# Patient Record
Sex: Male | Born: 1990 | Race: Black or African American | Hispanic: No | Marital: Single | Smoking: Current every day smoker
Health system: Southern US, Community
[De-identification: ages and names within clinical notes are randomized; demographics above are authoritative.]

## PROBLEM LIST (undated history)

## (undated) DIAGNOSIS — J45909 Unspecified asthma, uncomplicated: Secondary | ICD-10-CM

---

## 2012-03-17 ENCOUNTER — Other Ambulatory Visit (HOSPITAL_BASED_OUTPATIENT_CLINIC_OR_DEPARTMENT_OTHER): Payer: Self-pay | Admitting: Chiropractor

## 2012-03-17 DIAGNOSIS — S83209A Unspecified tear of unspecified meniscus, current injury, unspecified knee, initial encounter: Secondary | ICD-10-CM

## 2012-03-19 ENCOUNTER — Ambulatory Visit (HOSPITAL_BASED_OUTPATIENT_CLINIC_OR_DEPARTMENT_OTHER): Payer: Self-pay

## 2016-10-25 ENCOUNTER — Encounter (HOSPITAL_BASED_OUTPATIENT_CLINIC_OR_DEPARTMENT_OTHER): Payer: Self-pay | Admitting: Emergency Medicine

## 2016-10-25 ENCOUNTER — Emergency Department (HOSPITAL_BASED_OUTPATIENT_CLINIC_OR_DEPARTMENT_OTHER)
Admission: EM | Admit: 2016-10-25 | Discharge: 2016-10-26 | Disposition: A | Discharge status: Home / Self Care | Payer: Self-pay | Attending: Emergency Medicine | Admitting: Emergency Medicine

## 2016-10-25 DIAGNOSIS — R8299 Other abnormal findings in urine: Secondary | ICD-10-CM | POA: Insufficient documentation

## 2016-10-25 DIAGNOSIS — A5900 Urogenital trichomoniasis, unspecified: Secondary | ICD-10-CM | POA: Insufficient documentation

## 2016-10-25 DIAGNOSIS — M533 Sacrococcygeal disorders, not elsewhere classified: Secondary | ICD-10-CM

## 2016-10-25 DIAGNOSIS — J45909 Unspecified asthma, uncomplicated: Secondary | ICD-10-CM | POA: Insufficient documentation

## 2016-10-25 HISTORY — DX: Unspecified asthma, uncomplicated: J45.909

## 2016-10-25 LAB — URINALYSIS, MICROSCOPIC (REFLEX): Bacteria, UA: NONE SEEN

## 2016-10-25 LAB — URINALYSIS, ROUTINE W REFLEX MICROSCOPIC
Bilirubin Urine: NEGATIVE
Glucose, UA: NEGATIVE mg/dL
HGB URINE DIPSTICK: NEGATIVE
Ketones, ur: NEGATIVE mg/dL
Nitrite: NEGATIVE
PROTEIN: NEGATIVE mg/dL
Specific Gravity, Urine: 1.023 (ref 1.005–1.030)
pH: 6 (ref 5.0–8.0)

## 2016-10-25 NOTE — ED Triage Notes (Signed)
PT presents to ED with c/o lower back pain

## 2016-10-26 MED ORDER — NAPROXEN 500 MG PO TABS: 500.0000 mg | ORAL_TABLET | Freq: Two times a day (BID) | ORAL | 0 refills | Status: DC | PRN

## 2016-10-26 MED ORDER — NAPROXEN 250 MG PO TABS
500.0000 mg | ORAL_TABLET | Freq: Once | ORAL | Status: AC
  Administered 2016-10-26: 500 mg via ORAL
  Filled 2016-10-26: qty 2

## 2016-10-26 MED ORDER — METRONIDAZOLE 500 MG PO TABS
2000.0000 mg | ORAL_TABLET | Freq: Once | ORAL | Status: AC
  Administered 2016-10-26: 2000 mg via ORAL
  Filled 2016-10-26: qty 4

## 2016-10-26 NOTE — ED Provider Notes (Signed)
[facility] Provider Note: Lowella Dell, MD, FACEP  CSN: [id number] MRN: [id number] ARRIVAL: 10/25/16 at 2303 ROOM: MH02/MH02  By signing my name below, I, Soijett Blue, attest that this documentation has been prepared under the direction and in the presence of Paula Libra, MD. Electronically Signed: Soijett Blue, ED Scribe. 10/26/16. 12:22 AM.  CHIEF COMPLAINT  Back Pain   HISTORY OF PRESENT ILLNESS  Tom Liu is a 26 y.o. male who presents to the Emergency Department complaining of right lower back pain onset 1 week ago. The pain originates in the right SI joint. It radiates to his groin and he describes it as burning sensation. He notes that his right lower back pain is worsened with prolonged standing. Pt states that his lower back pain began following heavy lifting several months ago. Pt has not tried any medications for the relief of his symptoms. Pt denies dysuria, penile discharge or abdominal discomfort.    Past Medical History:  Diagnosis Date  . Asthma     History reviewed. No pertinent surgical history.  No family history on file.  Social History  Substance Use Topics  . Smoking status: Not on file  . Smokeless tobacco: Not on file  . Alcohol use Not on file    Prior to Admission medications   Medication Sig Start Date End Date Taking? Authorizing Provider  naproxen (NAPROSYN) 500 MG tablet Take 1 tablet (500 mg total) by mouth 2 (two) times daily as needed (for back pain). 10/26/16   Paula Libra, MD    Allergies Patient has no known allergies.   REVIEW OF SYSTEMS  Negative except as noted here or in the History of Present Illness.   PHYSICAL EXAMINATION  Initial Vital Signs Blood pressure (!) 136/93, pulse 84, temperature 97.9 F (36.6 C), temperature source Oral, resp. rate 16, height 6' 1.75" (1.873 m), weight 223 lb (101.2 kg), SpO2 99 %.  Examination General: Well-developed, well-nourished male in no acute distress; appearance  consistent with age of record HENT: normocephalic; atraumatic Eyes: pupils equal, round and reactive to light; extraocular muscles intact Neck: supple Heart: regular rate and rhythm Lungs: clear to auscultation bilaterally Abdomen: soft; nondistended; nontender; no masses or hepatosplenomegaly; bowel sounds present GU: No CVA tenderness Back: No lumbar or SI tenderness; negative SLR bilaterally. Extremities: No deformity; full range of motion; pulses normal Neurologic: Awake, alert and oriented; motor function intact in all extremities and symmetric; no facial droop Skin: Warm and dry Psychiatric: Normal mood and affect   RESULTS  Summary of this visit's results, reviewed by myself:   EKG Interpretation  Date/Time:    Ventricular Rate:    PR Interval:    QRS Duration:   QT Interval:    QTC Calculation:   R Axis:     Text Interpretation:        Laboratory Studies: Results for orders placed or performed during the hospital encounter of 10/25/16 (from the past 24 hour(s))  Urinalysis, Routine w reflex microscopic- may I&O cath if menses     Status: Abnormal   Collection Time: 10/25/16 11:25 PM  Result Value Ref Range   Color, Urine YELLOW YELLOW   APPearance CLEAR CLEAR   Specific Gravity, Urine 1.023 1.005 - 1.030   pH 6.0 5.0 - 8.0   Glucose, UA NEGATIVE NEGATIVE mg/dL   Hgb urine dipstick NEGATIVE NEGATIVE   Bilirubin Urine NEGATIVE NEGATIVE   Ketones, ur NEGATIVE NEGATIVE mg/dL   Protein, ur NEGATIVE NEGATIVE mg/dL  Nitrite NEGATIVE NEGATIVE   Leukocytes, UA LARGE (A) NEGATIVE  Urinalysis, Microscopic (reflex)     Status: Abnormal   Collection Time: 10/25/16 11:25 PM  Result Value Ref Range   RBC / HPF 0-5 0 - 5 RBC/hpf   WBC, UA 6-30 0 - 5 WBC/hpf   Bacteria, UA NONE SEEN NONE SEEN   Squamous Epithelial / LPF 0-5 (A) NONE SEEN   Trichomonas, UA PRESENT    Imaging Studies: No results found.  ED COURSE  Nursing notes and initial vitals signs, including  pulse oximetry, reviewed.  Vitals:   10/25/16 2309  BP: (!) 136/93  Pulse: 84  Resp: 16  Temp: 97.9 F (36.6 C)  TempSrc: Oral  SpO2: 99%  Weight: 223 lb (101.2 kg)  Height: 6' 1.75" (1.873 m)   12:27 AM We'll treat for trichomoniasis. Urine sent for culture, gonorrhea and chlamydia testing. We'll also test for HIV and syphilis.  PROCEDURES    ED DIAGNOSES     ICD-9-CM ICD-10-CM   1. Sacroiliac pain 724.6 M53.3   2. Urogenital trichomoniasis 131.00 A59.00    I personally performed the services described in this documentation, which was scribed in my presence. The recorded information has been reviewed and is accurate.     Paula Libra, MD 10/26/16 Jacinta Shoe

## 2016-10-27 LAB — RPR: RPR: NONREACTIVE

## 2016-10-27 LAB — HIV ANTIBODY (ROUTINE TESTING W REFLEX): HIV SCREEN 4TH GENERATION: NONREACTIVE

## 2016-10-27 LAB — URINE CULTURE: Culture: NO GROWTH

## 2016-10-27 LAB — GC/CHLAMYDIA PROBE AMP (~~LOC~~) NOT AT ARMC
Chlamydia: POSITIVE — AB
Neisseria Gonorrhea: NEGATIVE

## 2016-12-14 ENCOUNTER — Emergency Department (HOSPITAL_BASED_OUTPATIENT_CLINIC_OR_DEPARTMENT_OTHER): Payer: Self-pay

## 2016-12-14 ENCOUNTER — Encounter (HOSPITAL_BASED_OUTPATIENT_CLINIC_OR_DEPARTMENT_OTHER): Payer: Self-pay

## 2016-12-14 ENCOUNTER — Emergency Department (HOSPITAL_BASED_OUTPATIENT_CLINIC_OR_DEPARTMENT_OTHER)
Admission: EM | Admit: 2016-12-14 | Discharge: 2016-12-14 | Disposition: A | Discharge status: Home / Self Care | Payer: Self-pay | Attending: Emergency Medicine | Admitting: Emergency Medicine

## 2016-12-14 DIAGNOSIS — J9801 Acute bronchospasm: Secondary | ICD-10-CM | POA: Insufficient documentation

## 2016-12-14 MED ORDER — DEXAMETHASONE SODIUM PHOSPHATE 10 MG/ML IJ SOLN
10.0000 mg | Freq: Once | INTRAMUSCULAR | Status: AC
  Administered 2016-12-14: 10 mg via INTRAMUSCULAR
  Filled 2016-12-14: qty 1

## 2016-12-14 MED ORDER — ALBUTEROL SULFATE (2.5 MG/3ML) 0.083% IN NEBU
5.0000 mg | INHALATION_SOLUTION | RESPIRATORY_TRACT | Status: AC
  Administered 2016-12-14: 5 mg via RESPIRATORY_TRACT
  Filled 2016-12-14: qty 6

## 2016-12-14 MED ORDER — IPRATROPIUM BROMIDE 0.02 % IN SOLN
0.5000 mg | RESPIRATORY_TRACT | Status: AC
  Administered 2016-12-14: 0.5 mg via RESPIRATORY_TRACT
  Filled 2016-12-14: qty 2.5

## 2016-12-14 MED ORDER — ALBUTEROL SULFATE HFA 108 (90 BASE) MCG/ACT IN AERS
2.0000 | INHALATION_SPRAY | RESPIRATORY_TRACT | Status: DC | PRN
  Administered 2016-12-14: 2 via RESPIRATORY_TRACT
  Filled 2016-12-14: qty 6.7

## 2016-12-14 NOTE — ED Provider Notes (Signed)
[facility] Provider Note: Lowella Dell, MD, FACEP  CSN: [id number] MRN: [id number] ARRIVAL: 12/14/16 at 2150 ROOM: MH11/MH11   CHIEF COMPLAINT  Wheezing   HISTORY OF PRESENT ILLNESS  Tom Liu is a 26 y.o. male with a history of asthma. He has had a one-week history of shortness of breath with wheezing and chest discomfort. He had been using his home inhaler but without adequate relief. His symptoms worsened yesterday evening and he came to the ED this evening for evaluation. He rates his chest discomfort as a tightness which he rates as an 8 out of 10. He was evaluated by respiratory therapy who found him to be wheezing and administered an albuterol and Atrovent neb treatment with significant improvement in his wheezing and air movement. He has had a cough occasionally productive of yellow sputum. He denies fever or chills. He denies nausea, vomiting or diarrhea.   Past Medical History:  Diagnosis Date  . Asthma     History reviewed. No pertinent surgical history.  No family history on file.  Social History  Substance Use Topics  . Smoking status: Not on file  . Smokeless tobacco: Not on file  . Alcohol use Not on file    Prior to Admission medications   Not on File    Allergies Patient has no known allergies.   REVIEW OF SYSTEMS  Negative except as noted here or in the History of Present Illness.   PHYSICAL EXAMINATION  Initial Vital Signs Blood pressure 109/72, pulse (!) 115, temperature 98.6 F (37 C), temperature source Oral, resp. rate 20, height 6\' 1"  (1.854 m), weight 102.1 kg (225 lb), SpO2 99 %.  Examination General: Well-developed, well-nourished male in no acute distress; appearance consistent with age of record HENT: normocephalic; atraumatic Eyes: pupils equal, round and reactive to light; extraocular muscles intact Neck: supple Heart: regular rate and rhythm Lungs: clear to auscultation bilaterally; occasional rattly  cough Abdomen: soft; nondistended; nontender; no masses or hepatosplenomegaly; bowel sounds present Extremities: No deformity; full range of motion; pulses normal Neurologic: Awake, alert and oriented; motor function intact in all extremities and symmetric; no facial droop Skin: Warm and dry Psychiatric: Normal mood and affect   RESULTS  Summary of this visit's results, reviewed by myself:   EKG Interpretation  Date/Time:  Monday Dec 14 2016 21:58:19 EDT Ventricular Rate:  117 PR Interval:  164 QRS Duration: 96 QT Interval:  324 QTC Calculation: 451 R Axis:   112 Text Interpretation:  Sinus tachycardia Possible Right ventricular hypertrophy Cannot rule out Inferior infarct , age undetermined Abnormal ECG No previous ECGs available Confirmed by Paula Libra ([id number]) on 12/14/2016 11:18:19 PM      Laboratory Studies: No results found for this or any previous visit (from the past 24 hour(s)). Imaging Studies: Dg Chest 2 View  Result Date: 12/14/2016 CLINICAL DATA:  Initial evaluation for acute wheezing. EXAM: CHEST  2 VIEW COMPARISON:  Prior radiograph 08/24/2014. FINDINGS: The cardiac and mediastinal silhouettes are stable in size and contour, and remain within normal limits. The lungs are normally inflated. No airspace consolidation, pleural effusion, or pulmonary edema is identified. There is no pneumothorax. No acute osseous abnormality identified. IMPRESSION: No active cardiopulmonary disease. Electronically Signed   By: Rise Mu M.D.   On: 12/14/2016 22:31    ED COURSE  Nursing notes and initial vitals signs, including pulse oximetry, reviewed.  Vitals:   12/14/16 2159 12/14/16 2202 12/14/16 2206  BP: 109/72  Pulse: (!) 115    Resp: 20    Temp: 98.6 F (37 C)    TempSrc: Oral    SpO2: 99%  99%  Weight:  102.1 kg (225 lb)   Height:  6\' 1"  (1.854 m)     PROCEDURES    ED DIAGNOSES     ICD-9-CM ICD-10-CM   1. Bronchospasm 519.11 J98.01         Hibba Schram, Jonny RuizJohn, MD 12/14/16 2329

## 2016-12-14 NOTE — ED Triage Notes (Signed)
Pt c/o chest tightness and wheezing for a week, has expired inhaler at home that he's been using without relief

## 2016-12-14 NOTE — ED Notes (Signed)
Pt verbalizes understanding of d/c instructions and denies any further needs at this time. 

## 2017-03-01 ENCOUNTER — Encounter (HOSPITAL_BASED_OUTPATIENT_CLINIC_OR_DEPARTMENT_OTHER): Payer: Self-pay

## 2017-03-01 ENCOUNTER — Emergency Department (HOSPITAL_BASED_OUTPATIENT_CLINIC_OR_DEPARTMENT_OTHER): Payer: Self-pay

## 2017-03-01 ENCOUNTER — Emergency Department (HOSPITAL_BASED_OUTPATIENT_CLINIC_OR_DEPARTMENT_OTHER)
Admission: EM | Admit: 2017-03-01 | Discharge: 2017-03-01 | Disposition: A | Discharge status: Home / Self Care | Payer: Self-pay | Attending: Emergency Medicine | Admitting: Emergency Medicine

## 2017-03-01 DIAGNOSIS — M25461 Effusion, right knee: Secondary | ICD-10-CM | POA: Insufficient documentation

## 2017-03-01 DIAGNOSIS — M25561 Pain in right knee: Secondary | ICD-10-CM

## 2017-03-01 DIAGNOSIS — F17210 Nicotine dependence, cigarettes, uncomplicated: Secondary | ICD-10-CM | POA: Insufficient documentation

## 2017-03-01 DIAGNOSIS — J45909 Unspecified asthma, uncomplicated: Secondary | ICD-10-CM | POA: Insufficient documentation

## 2017-03-01 NOTE — ED Notes (Signed)
MD with pt  

## 2017-03-01 NOTE — ED Notes (Signed)
Work note given that pt an return on 8-7 with no restrictions.

## 2017-03-01 NOTE — ED Notes (Signed)
Ice pack given and knee sleeve applied.

## 2017-03-01 NOTE — ED Notes (Signed)
Report received 

## 2017-03-01 NOTE — Discharge Instructions (Signed)
I suspect you have a partial tear in your knee cartilage. That usually heals by itself, but can take up to six months. You will need to see an orthopedic doctor, and probably get an MRI scan to be sure.  Ice your knee before going to work, during any break at work, and when you get home. Wear an elastic knee brace as needed. Take either ibuprofen (3 tablets at a time, four times a day) or naproxen (two tablets twice a day.

## 2017-03-01 NOTE — ED Triage Notes (Signed)
Reports dislocated right knee in July and still having pain with standing in his quad region.  Reports work sent him to be evaluated due to risk of injury

## 2017-03-01 NOTE — ED Provider Notes (Signed)
[facility]   CSN: [id number] Arrival date & time: 03/01/17  0011     History   Chief Complaint Chief Complaint  Patient presents with  . Leg Pain    HPI Tom Liu is a 26 y.o. male.  The history is provided by the patient.  Leg Pain    He complains of ongoing pain in his right thigh since dislocating his kneecap on July 4. He states that he had also dislocated his kneecap when he was 26 years old. Kneecap was dislocated medially, and he reduced it himself. He went to [facility], and was referred to an orthopedic physician, but did not follow-up because of lack of funds. Since then, he continues to have pain in his right thigh anteriorly. Pain is moderate and he rates it at 5/10. He has also noticed some swelling in his right knee. He has also noted his knee giving way at times, and also locking occasionally.  Past Medical History:  Diagnosis Date  . Asthma     There are no active problems to display for this patient.   History reviewed. No pertinent surgical history.     Home Medications    Prior to Admission medications   Not on File    Family History No family history on file.  Social History Social History  Substance Use Topics  . Smoking status: Current Every Day Smoker    Types: Cigarettes  . Smokeless tobacco: Never Used  . Alcohol use No     Allergies   Patient has no known allergies.   Review of Systems Review of Systems  All other systems reviewed and are negative.    Physical Exam Updated Vital Signs BP (!) 107/91 (BP Location: Left Arm)   Pulse 95   Temp 99.3 F (37.4 C) (Oral)   Resp 18   Ht 6\' 2"  (1.88 m)   Wt 97.5 kg (215 lb)   SpO2 98%   BMI 27.60 kg/m   Physical Exam  Nursing note and vitals reviewed.  26 year old male, resting comfortably and in no acute distress. Vital signs are normal. Oxygen saturation is 98%, which is normal. Head is normocephalic and  atraumatic. PERRLA, EOMI. Oropharynx is clear. Neck is nontender and supple without adenopathy or JVD. Back is nontender and there is no CVA tenderness. Lungs are clear without rales, wheezes, or rhonchi. Chest is nontender. Heart has regular rate and rhythm without murmur. Abdomen is soft, flat, nontender without masses or hepatosplenomegaly and peristalsis is normoactive. Extremities: Small to moderate effusion present in the right knee. No tenderness to palpation. Pain elicited with passive flexion past 90 with positive McMurray's test on the left. Lachman test is negative. Skin is warm and dry without rash. Neurologic: Mental status is normal, cranial nerves are intact, there are no motor or sensory deficits.  ED Treatments / Results   Radiology Dg Knee Complete 4 Views Right  Result Date: 03/01/2017 CLINICAL DATA:  Right knee injury playing basketball on 01/27/2017 with pain and swelling since. History of knee dislocations several times. EXAM: RIGHT KNEE - COMPLETE 4+ VIEW COMPARISON:  Exams dating back through 02/27/2012 FINDINGS: No acute fracture or dislocation. No significant joint effusion. Well corticated ossifications along the medial aspect of the knee along the expected location of the MCL may reflect old remote trauma but is chronic in appearance. IMPRESSION: 1. No acute osseous abnormality, dislocation or joint effusion. 2. Three well corticated soft tissue  ossifications along the medial aspect of the knee are chronic and may reflect old remote trauma possibly along the MCL. 3. Cartilaginous, ligamentous and meniscal injuries would be better assessed with MRI if clinically suspected on a nonemergent basis. Electronically Signed   By: Tollie Eth M.D.   On: 03/01/2017 01:18    Procedures Procedures (including critical care time)  Medications Ordered in ED Medications - No data to display   Initial Impression / Assessment and Plan / ED Course  I have reviewed the triage vital  signs and the nursing notes.  Pertinent  imaging results that were available during my care of the patient were reviewed by me and considered in my medical decision making (see chart for details).  Right distal thigh pain and right knee effusion following dislocation of the right patella. Physical exam is suggestive of partial tear of meniscus. He is advised to use a neoprene sleeve for support, and is referred to orthopedics for follow-up. MRI will been needed to definitely diagnose a meniscus tear, but this should be treated conservatively unless it fails to heal. Old records are reviewed confirming ED visit in [facility] one month ago with negative x-rays and orthopedic referral.  Final Clinical Impressions(s) / ED Diagnoses   Final diagnoses:  Right knee pain, unspecified chronicity  Effusion of right knee joint    New Prescriptions New Prescriptions   No medications on file     Dione Booze, MD 03/01/17 [phone]

## 2017-06-04 IMAGING — CR DG CHEST 2V
2 series · 2 of 2 positions shown · non-contrast
Comparison: Prior radiograph 08/24/2014.

CLINICAL DATA: Initial evaluation for acute wheezing.

EXAM:
CHEST  2 VIEW

[w chest pa]
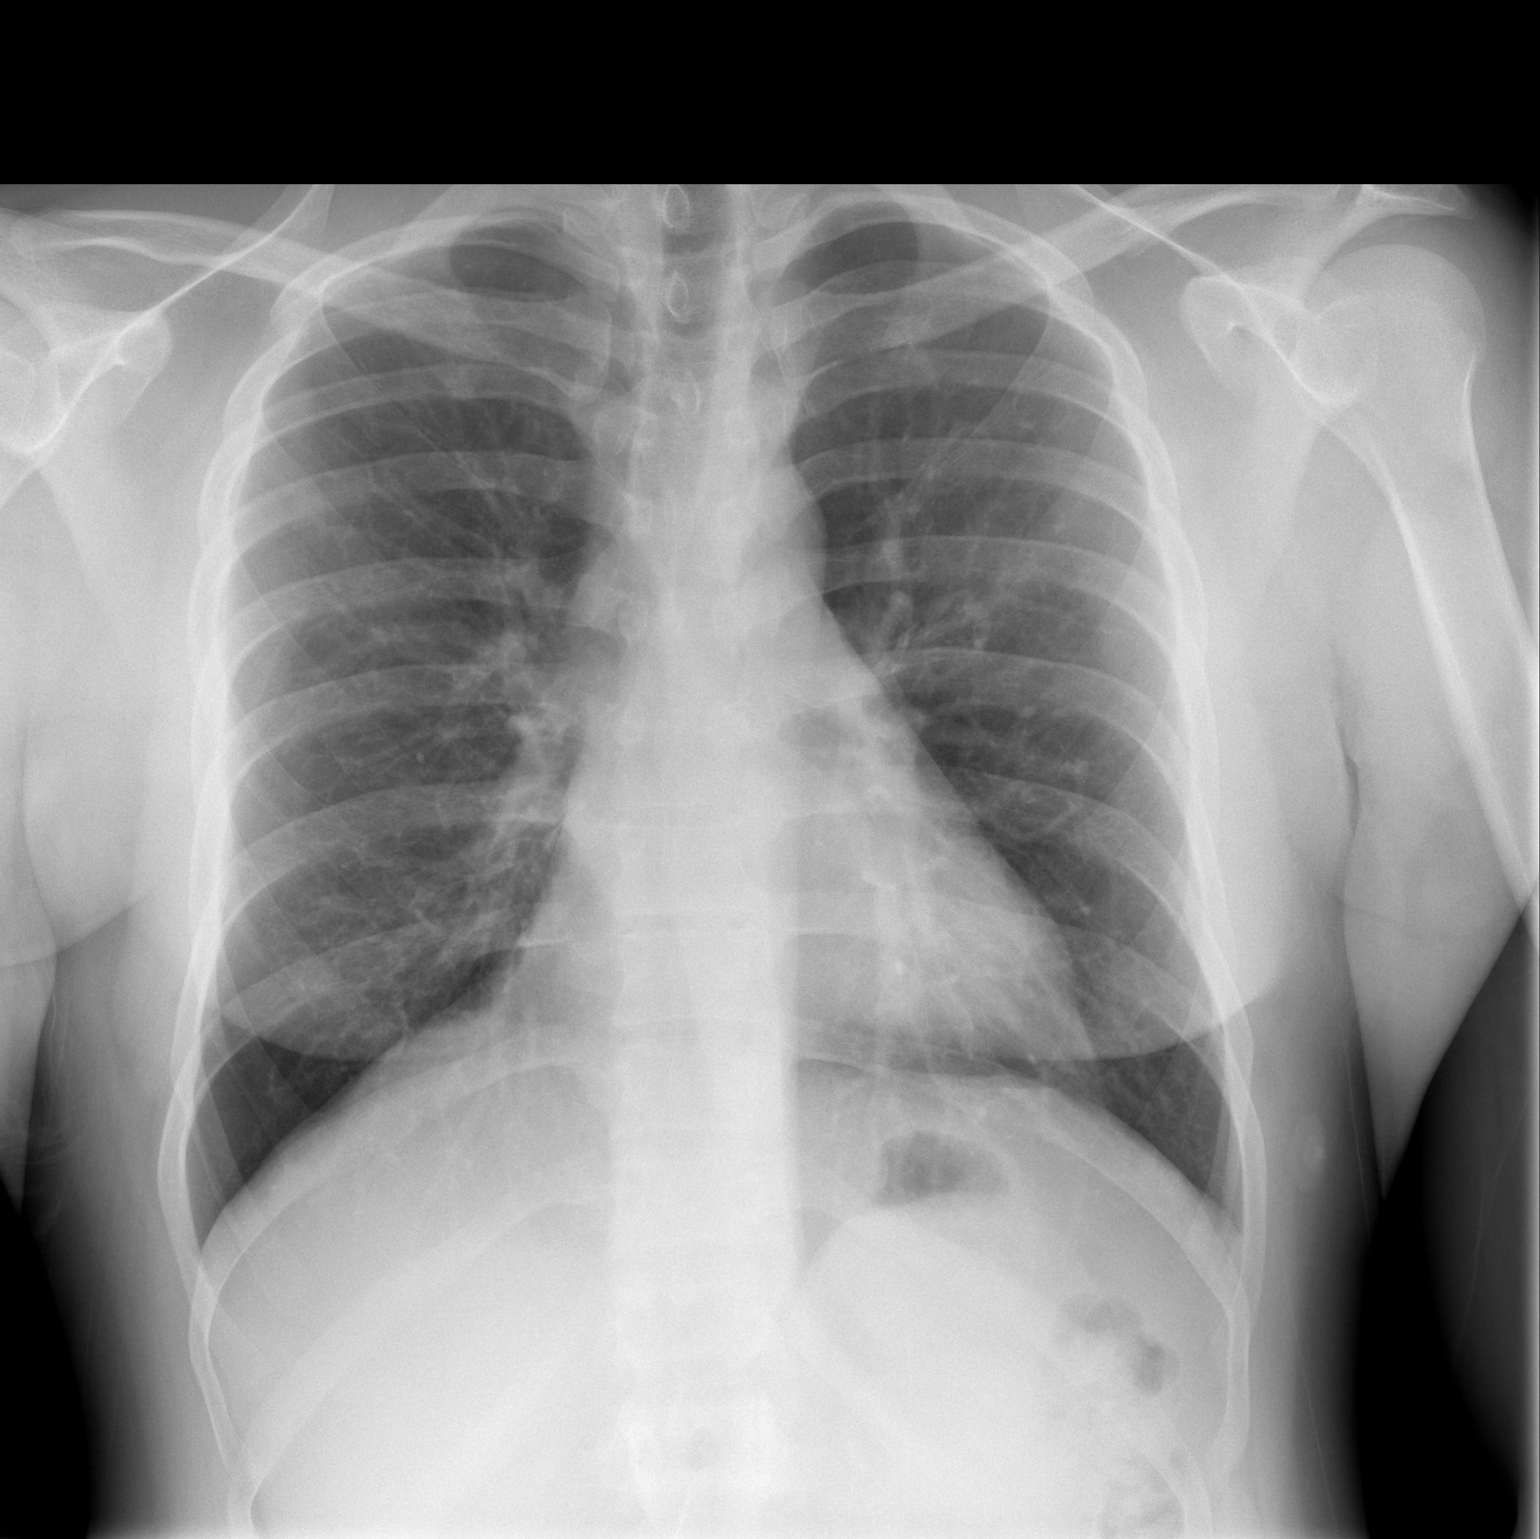

[w chest lat]
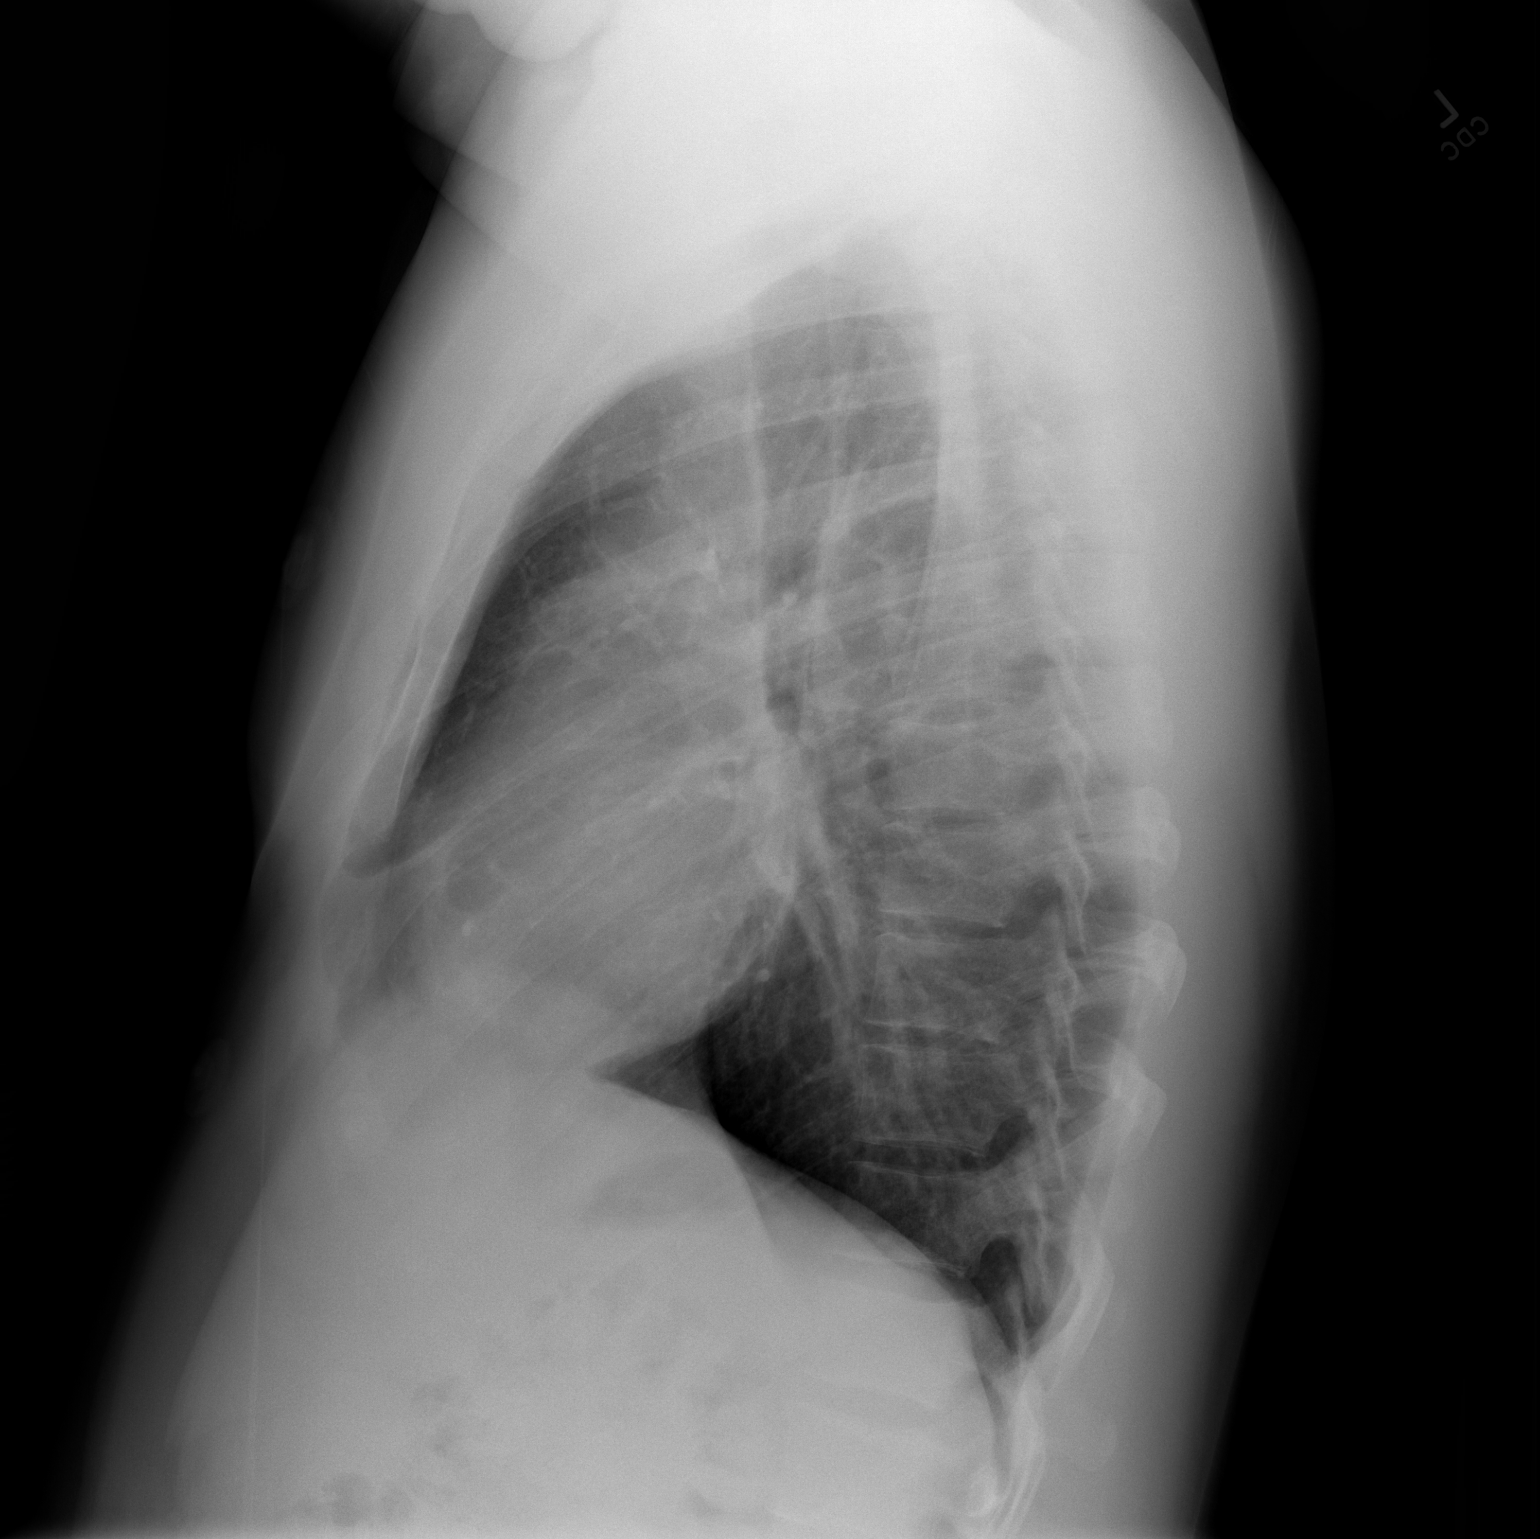

[2 of 2 positions shown; findings below may reference images not displayed]

FINDINGS: The cardiac and mediastinal silhouettes are stable in size and
contour, and remain within normal limits.

The lungs are normally inflated. No airspace consolidation, pleural
effusion, or pulmonary edema is identified. There is no
pneumothorax.

No acute osseous abnormality identified.
IMPRESSION: No active cardiopulmonary disease.

## 2018-05-01 ENCOUNTER — Other Ambulatory Visit: Payer: Self-pay

## 2018-05-01 ENCOUNTER — Emergency Department (HOSPITAL_BASED_OUTPATIENT_CLINIC_OR_DEPARTMENT_OTHER)
Admission: EM | Admit: 2018-05-01 | Discharge: 2018-05-01 | Disposition: A | Discharge status: Home / Self Care | Payer: Self-pay | Attending: Emergency Medicine | Admitting: Emergency Medicine

## 2018-05-01 ENCOUNTER — Encounter (HOSPITAL_BASED_OUTPATIENT_CLINIC_OR_DEPARTMENT_OTHER): Payer: Self-pay | Admitting: Emergency Medicine

## 2018-05-01 DIAGNOSIS — R8281 Pyuria: Secondary | ICD-10-CM

## 2018-05-01 DIAGNOSIS — F17210 Nicotine dependence, cigarettes, uncomplicated: Secondary | ICD-10-CM | POA: Insufficient documentation

## 2018-05-01 DIAGNOSIS — N50819 Testicular pain, unspecified: Secondary | ICD-10-CM

## 2018-05-01 DIAGNOSIS — M545 Low back pain, unspecified: Secondary | ICD-10-CM

## 2018-05-01 DIAGNOSIS — J45909 Unspecified asthma, uncomplicated: Secondary | ICD-10-CM | POA: Insufficient documentation

## 2018-05-01 LAB — URINALYSIS, ROUTINE W REFLEX MICROSCOPIC
BILIRUBIN URINE: NEGATIVE
GLUCOSE, UA: NEGATIVE mg/dL
HGB URINE DIPSTICK: NEGATIVE
Ketones, ur: NEGATIVE mg/dL
Nitrite: NEGATIVE
PROTEIN: NEGATIVE mg/dL
Specific Gravity, Urine: 1.02 (ref 1.005–1.030)
pH: 6 (ref 5.0–8.0)

## 2018-05-01 LAB — URINALYSIS, MICROSCOPIC (REFLEX)

## 2018-05-01 MED ORDER — CEFTRIAXONE SODIUM 250 MG IJ SOLR
250.0000 mg | Freq: Once | INTRAMUSCULAR | Status: AC
  Administered 2018-05-01: 250 mg via INTRAMUSCULAR
  Filled 2018-05-01: qty 250

## 2018-05-01 MED ORDER — AZITHROMYCIN 250 MG PO TABS
1000.0000 mg | ORAL_TABLET | Freq: Once | ORAL | Status: AC
  Administered 2018-05-01: 1000 mg via ORAL
  Filled 2018-05-01: qty 4

## 2018-05-01 MED ORDER — IBUPROFEN 600 MG PO TABS: 600.0000 mg | ORAL_TABLET | Freq: Four times a day (QID) | ORAL | 0 refills | Status: DC | PRN

## 2018-05-01 MED ORDER — IBUPROFEN 600 MG PO TABS
600.0000 mg | ORAL_TABLET | Freq: Four times a day (QID) | ORAL | 0 refills | Status: AC | PRN
Start: 2018-05-01 — End: ?

## 2018-05-01 NOTE — ED Notes (Signed)
ED Provider at bedside. 

## 2018-05-01 NOTE — ED Provider Notes (Addendum)
[facility] EMERGENCY DEPARTMENT Provider Note   CSN: [id number] Arrival date & time: 05/01/18  0900     History   Chief Complaint Chief Complaint  Patient presents with  . Back Pain    HPI Tom Liu is a 27 y.o. male.  HPI  27 year old male comes in with chief complaint of back pain and testicle pain. 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  On Friday after he returned from work he started noticing pain in his back.  As the day progressed he appreciated constant back pain with intermittent episodes of throbbing type scrotal pain.  The scrotal pain lasts only for a few seconds and is unprovoked.  Patient denies any history of similar pain in the past.  Patient denies any bulging that he has appreciated and denies any dysuria, n/v/f/c.  Past Medical History:  Diagnosis Date  . Asthma     There are no active problems to display for this patient.   History reviewed. No pertinent surgical history.      Home Medications    Prior to Admission medications   Medication Sig Start Date End Date Taking? Authorizing Provider  ibuprofen (ADVIL,MOTRIN) 600 MG tablet Take 1 tablet (600 mg total) by mouth every 6 (six) hours as needed. 05/01/18   Derwood Kaplan, MD    Family History No family history on file.  Social History Social History   Tobacco Use  . Smoking status: Current Every Day Smoker    Types: Cigarettes  . Smokeless tobacco: Never Used  Substance Use Topics  . Alcohol use: Yes  . Drug use: No     Allergies   Patient has no known allergies.   Review of Systems Review of Systems  Constitutional: Positive for activity change.  Gastrointestinal: Negative for abdominal distention.  Genitourinary: Positive for testicular pain. Negative for dysuria and scrotal swelling.  Musculoskeletal: Positive for back pain.     Physical Exam Updated Vital Signs BP 137/80 (BP Location: Right Arm)   Pulse 63   Temp 98 F  (36.7 C) (Oral)   Resp 14   Ht 6' 1.75" (1.873 m)   Wt 102.1 kg   SpO2 100%   BMI 29.08 kg/m   Physical Exam  Constitutional: He is oriented to person, place, and time. He appears well-developed.  HENT:  Head: Atraumatic.  Neck: Neck supple.  Cardiovascular: Normal rate.  Pulmonary/Chest: Effort normal.  Abdominal: Soft.  Genitourinary:  Genitourinary Comments: No inguinal hernia, scrotal hernia appreciated.  Testicles are freely moving.  Neurological: He is alert and oriented to person, place, and time.  Skin: Skin is warm.  Nursing note and vitals reviewed.    ED Treatments / Results  Labs (all labs ordered are listed, but only abnormal results are displayed) Labs Reviewed  URINALYSIS, ROUTINE W REFLEX MICROSCOPIC - Abnormal; Notable for the following components:      Result Value   Leukocytes, UA MODERATE (*)    All other components within normal limits  URINALYSIS, MICROSCOPIC (REFLEX) - Abnormal; Notable for the following components:   Bacteria, UA MANY (*)    All other components within normal limits  GC/CHLAMYDIA PROBE AMP ([place]) NOT AT [facility]    EKG None  Radiology No results found.  Procedures Procedures (including critical care time)  Medications Ordered in ED Medications  cefTRIAXone (ROCEPHIN) injection 250 mg (has no administration in time range)  azithromycin (ZITHROMAX) tablet 1,000 mg (has no administration in time range)  Initial Impression / Assessment and Plan / ED Course  I have reviewed the triage vital signs and the nursing notes.  Pertinent labs & imaging results that were available during my care of the patient were reviewed by me and considered in my medical decision making (see chart for details).  Clinical Course as of May 01 1013  Sun May 01, 2018  1013 UA does show pyuria and many bacteria.  It is leukocyte positive.  Patient does not have any dysuria, urinary frequency or hematuria.  He however does however admit to  unprotected intercourse and wants to be covered for [facility] and chlamydia.  Those test have also been added to the urine analysis.  Patient informed that his symptoms could be because of something besides STD therefore he needs to return to the ER if his symptoms get worse.  Though strict ER return precautions have been verbally discussed and also placed in the discharge instructions.  Bacteria, UA(!): MANY [AN]    Clinical Course User Index [AN] Derwood Kaplan, MD    27 year old male comes in with chief complaint of back pain.  Patient reports that his pain is located in the lower back and it radiates down towards his testicles intermittently.  Scrotal exam is normal.  Abdominal exam is benign.  No signs of hernia, torsion or even scrotal edema.  UA ordered to ensure there is no inflammatory cells that would get his concern about possible infectious processes.  Patient does not have any UTI-like symptoms which is reassuring.  If the UA is negative we will discharge the patient.  He will be given instructions on hernia and also be given work note to prevent lifting more than 20 pounds for the next week.  Patient does not have any special harness for work, he has been advised to invest in buying appropriate protective gear.  Final Clinical Impressions(s) / ED Diagnoses   Final diagnoses:  Pyuria  Acute bilateral low back pain without sciatica  Testicle pain    ED Discharge Orders         Ordered    ibuprofen (ADVIL,MOTRIN) 600 MG tablet  Every 6 hours PRN,   Status:  Discontinued     05/01/18 1012    ibuprofen (ADVIL,MOTRIN) 600 MG tablet  Every 6 hours PRN     05/01/18 1013           Derwood Kaplan, MD 05/01/18 1610    Derwood Kaplan, MD 05/01/18 1014

## 2018-05-01 NOTE — ED Triage Notes (Signed)
Low back pain radiating into testicles x 3 days. States he does heavy lifting at work.

## 2018-05-01 NOTE — Discharge Instructions (Signed)
We saw in the ER for your back pain and testicular pain There is evidence of inflammation in your urine, therefore at your request we have given you antibiotics that will cover you for common STDs.  The pain could be because of other causes as well.  If you start having burning with urination, nausea, vomiting, fevers, chills, constant scrotal pain or swelling of your scrotum return to the ER.  Read the instructions on hernia and get the right equipment at work to prevent hernia.

## 2018-05-01 NOTE — ED Notes (Signed)
Pt/family verbalized understanding of discharge instructions.   

## 2018-05-02 LAB — GC/CHLAMYDIA PROBE AMP (~~LOC~~) NOT AT ARMC
Chlamydia: POSITIVE — AB
NEISSERIA GONORRHEA: NEGATIVE
